# Patient Record
Sex: Female | Born: 1950 | Race: White | Hispanic: No | Marital: Married | Smoking: Never smoker
Health system: Southern US, Community
[De-identification: ages and names within clinical notes are randomized; demographics above are authoritative.]

## PROBLEM LIST (undated history)

## (undated) DIAGNOSIS — E079 Disorder of thyroid, unspecified: Secondary | ICD-10-CM

## (undated) DIAGNOSIS — M1990 Unspecified osteoarthritis, unspecified site: Secondary | ICD-10-CM

## (undated) HISTORY — PX: BREAST LUMPECTOMY: SHX2

## (undated) HISTORY — DX: Unspecified osteoarthritis, unspecified site: M19.90

## (undated) HISTORY — DX: Disorder of thyroid, unspecified: E07.9

---

## 1997-10-13 ENCOUNTER — Other Ambulatory Visit: Admission: RE | Admit: 1997-10-13 | Discharge: 1997-10-13 | Payer: Self-pay | Admitting: Obstetrics & Gynecology

## 1998-01-22 ENCOUNTER — Other Ambulatory Visit: Admission: RE | Admit: 1998-01-22 | Discharge: 1998-01-22 | Payer: Self-pay | Admitting: Orthopedic Surgery

## 1999-03-04 ENCOUNTER — Other Ambulatory Visit: Admission: RE | Admit: 1999-03-04 | Discharge: 1999-03-04 | Payer: Self-pay | Admitting: Obstetrics & Gynecology

## 1999-07-22 ENCOUNTER — Ambulatory Visit (HOSPITAL_COMMUNITY): Admission: RE | Admit: 1999-07-22 | Discharge: 1999-07-22 | Payer: Self-pay

## 2001-11-18 ENCOUNTER — Other Ambulatory Visit: Admission: RE | Admit: 2001-11-18 | Discharge: 2001-11-18 | Payer: Self-pay | Admitting: Obstetrics & Gynecology

## 2002-11-22 ENCOUNTER — Other Ambulatory Visit: Admission: RE | Admit: 2002-11-22 | Discharge: 2002-11-22 | Payer: Self-pay | Admitting: Obstetrics & Gynecology

## 2002-11-28 ENCOUNTER — Encounter (INDEPENDENT_AMBULATORY_CARE_PROVIDER_SITE_OTHER): Payer: Self-pay | Admitting: Specialist

## 2002-11-28 ENCOUNTER — Encounter: Payer: Self-pay | Admitting: Obstetrics & Gynecology

## 2002-11-28 ENCOUNTER — Encounter: Admission: RE | Admit: 2002-11-28 | Discharge: 2002-11-28 | Payer: Self-pay | Admitting: Obstetrics & Gynecology

## 2002-11-28 ENCOUNTER — Encounter (INDEPENDENT_AMBULATORY_CARE_PROVIDER_SITE_OTHER): Payer: Self-pay | Admitting: Radiology

## 2002-12-02 ENCOUNTER — Encounter (HOSPITAL_COMMUNITY): Admission: RE | Admit: 2002-12-02 | Discharge: 2003-03-02 | Payer: Self-pay | Admitting: General Surgery

## 2002-12-05 ENCOUNTER — Encounter: Payer: Self-pay | Admitting: General Surgery

## 2002-12-05 ENCOUNTER — Encounter: Admission: RE | Admit: 2002-12-05 | Discharge: 2002-12-05 | Payer: Self-pay | Admitting: General Surgery

## 2002-12-07 ENCOUNTER — Encounter: Payer: Self-pay | Admitting: General Surgery

## 2002-12-07 ENCOUNTER — Ambulatory Visit (HOSPITAL_COMMUNITY): Admission: RE | Admit: 2002-12-07 | Discharge: 2002-12-07 | Payer: Self-pay | Admitting: General Surgery

## 2002-12-07 ENCOUNTER — Ambulatory Visit (HOSPITAL_BASED_OUTPATIENT_CLINIC_OR_DEPARTMENT_OTHER): Admission: RE | Admit: 2002-12-07 | Discharge: 2002-12-07 | Payer: Self-pay | Admitting: General Surgery

## 2002-12-07 ENCOUNTER — Encounter (INDEPENDENT_AMBULATORY_CARE_PROVIDER_SITE_OTHER): Payer: Self-pay | Admitting: Specialist

## 2003-01-02 ENCOUNTER — Ambulatory Visit: Admission: RE | Admit: 2003-01-02 | Discharge: 2003-04-02 | Payer: Self-pay | Admitting: *Deleted

## 2003-02-08 ENCOUNTER — Encounter: Admission: RE | Admit: 2003-02-08 | Discharge: 2003-02-08 | Payer: Self-pay | Admitting: *Deleted

## 2003-04-28 ENCOUNTER — Ambulatory Visit: Admission: RE | Admit: 2003-04-28 | Discharge: 2003-04-28 | Payer: Self-pay | Admitting: *Deleted

## 2003-08-02 ENCOUNTER — Encounter: Admission: RE | Admit: 2003-08-02 | Discharge: 2003-08-02 | Payer: Self-pay | Admitting: Oncology

## 2003-08-22 ENCOUNTER — Ambulatory Visit: Admission: RE | Admit: 2003-08-22 | Discharge: 2003-09-06 | Payer: Self-pay | Admitting: *Deleted

## 2003-11-29 ENCOUNTER — Encounter: Admission: RE | Admit: 2003-11-29 | Discharge: 2003-11-29 | Payer: Self-pay | Admitting: Oncology

## 2003-12-05 ENCOUNTER — Other Ambulatory Visit: Admission: RE | Admit: 2003-12-05 | Discharge: 2003-12-05 | Payer: Self-pay | Admitting: Obstetrics & Gynecology

## 2004-01-29 ENCOUNTER — Ambulatory Visit: Payer: Self-pay | Admitting: Oncology

## 2004-07-29 ENCOUNTER — Ambulatory Visit: Payer: Self-pay | Admitting: Oncology

## 2004-12-06 ENCOUNTER — Encounter: Admission: RE | Admit: 2004-12-06 | Discharge: 2004-12-06 | Payer: Self-pay | Admitting: Oncology

## 2005-01-23 ENCOUNTER — Ambulatory Visit: Payer: Self-pay | Admitting: Oncology

## 2005-02-11 ENCOUNTER — Other Ambulatory Visit: Admission: RE | Admit: 2005-02-11 | Discharge: 2005-02-11 | Payer: Self-pay | Admitting: Obstetrics & Gynecology

## 2005-02-13 ENCOUNTER — Encounter: Admission: RE | Admit: 2005-02-13 | Discharge: 2005-02-13 | Payer: Self-pay | Admitting: Oncology

## 2005-03-04 ENCOUNTER — Ambulatory Visit (HOSPITAL_COMMUNITY): Admission: RE | Admit: 2005-03-04 | Discharge: 2005-03-04 | Payer: Self-pay | Admitting: Endocrinology

## 2005-03-10 ENCOUNTER — Encounter (INDEPENDENT_AMBULATORY_CARE_PROVIDER_SITE_OTHER): Payer: Self-pay | Admitting: Specialist

## 2005-03-10 ENCOUNTER — Other Ambulatory Visit: Admission: RE | Admit: 2005-03-10 | Discharge: 2005-03-10 | Payer: Self-pay | Admitting: Interventional Radiology

## 2005-03-10 ENCOUNTER — Encounter: Admission: RE | Admit: 2005-03-10 | Discharge: 2005-03-10 | Payer: Self-pay | Admitting: Endocrinology

## 2005-07-30 ENCOUNTER — Ambulatory Visit: Payer: Self-pay | Admitting: Oncology

## 2005-08-01 LAB — CBC WITH DIFFERENTIAL/PLATELET
Basophils Absolute: 0 10*3/uL (ref 0.0–0.1)
EOS%: 1.2 % (ref 0.0–7.0)
Eosinophils Absolute: 0.1 10*3/uL (ref 0.0–0.5)
HCT: 37.5 % (ref 34.8–46.6)
HGB: 12.4 g/dL (ref 11.6–15.9)
MCH: 27.6 pg (ref 26.0–34.0)
MONO#: 0.8 10*3/uL (ref 0.1–0.9)
NEUT%: 65.4 % (ref 39.6–76.8)
lymph#: 2.5 10*3/uL (ref 0.9–3.3)

## 2005-08-01 LAB — COMPREHENSIVE METABOLIC PANEL
Albumin: 4.4 g/dL (ref 3.5–5.2)
BUN: 18 mg/dL (ref 6–23)
CO2: 28 mEq/L (ref 19–32)
Calcium: 9 mg/dL (ref 8.4–10.5)
Chloride: 105 mEq/L (ref 96–112)
Glucose, Bld: 88 mg/dL (ref 70–99)
Potassium: 4.2 mEq/L (ref 3.5–5.3)

## 2005-12-08 ENCOUNTER — Encounter: Admission: RE | Admit: 2005-12-08 | Discharge: 2005-12-08 | Payer: Self-pay | Admitting: Oncology

## 2006-01-22 ENCOUNTER — Ambulatory Visit: Payer: Self-pay | Admitting: Oncology

## 2006-01-27 LAB — COMPREHENSIVE METABOLIC PANEL
AST: 16 U/L (ref 0–37)
Alkaline Phosphatase: 128 U/L — ABNORMAL HIGH (ref 39–117)
BUN: 15 mg/dL (ref 6–23)
Calcium: 9.5 mg/dL (ref 8.4–10.5)
Chloride: 100 mEq/L (ref 96–112)
Creatinine, Ser: 0.71 mg/dL (ref 0.40–1.20)

## 2006-01-27 LAB — CBC WITH DIFFERENTIAL/PLATELET
Eosinophils Absolute: 0.2 10*3/uL (ref 0.0–0.5)
MCV: 84.1 fL (ref 81.0–101.0)
MONO%: 9.4 % (ref 0.0–13.0)
NEUT#: 6 10*3/uL (ref 1.5–6.5)
RBC: 4.6 10*6/uL (ref 3.70–5.32)
RDW: 14.6 % — ABNORMAL HIGH (ref 11.3–14.5)
WBC: 10.2 10*3/uL — ABNORMAL HIGH (ref 3.9–10.0)

## 2006-01-27 LAB — CANCER ANTIGEN 27.29: CA 27.29: 8 U/mL (ref 0–39)

## 2006-03-18 ENCOUNTER — Ambulatory Visit: Payer: Self-pay | Admitting: Oncology

## 2006-07-07 ENCOUNTER — Encounter: Admission: RE | Admit: 2006-07-07 | Discharge: 2006-07-07 | Payer: Self-pay | Admitting: Family Medicine

## 2006-09-07 ENCOUNTER — Encounter: Admission: RE | Admit: 2006-09-07 | Discharge: 2006-09-07 | Payer: Self-pay | Admitting: Endocrinology

## 2006-12-09 ENCOUNTER — Ambulatory Visit: Payer: Self-pay | Admitting: Oncology

## 2006-12-09 LAB — CBC WITH DIFFERENTIAL/PLATELET
Basophils Absolute: 0 10*3/uL (ref 0.0–0.1)
Eosinophils Absolute: 0.1 10*3/uL (ref 0.0–0.5)
HGB: 13.7 g/dL (ref 11.6–15.9)
NEUT#: 6.5 10*3/uL (ref 1.5–6.5)
RBC: 4.73 10*6/uL (ref 3.70–5.32)
RDW: 14.4 % (ref 11.3–14.5)
WBC: 10.2 10*3/uL — ABNORMAL HIGH (ref 3.9–10.0)
lymph#: 3 10*3/uL (ref 0.9–3.3)

## 2006-12-09 LAB — COMPREHENSIVE METABOLIC PANEL
ALT: 20 U/L (ref 0–35)
CO2: 25 mEq/L (ref 19–32)
Calcium: 9.5 mg/dL (ref 8.4–10.5)
Chloride: 101 mEq/L (ref 96–112)
Creatinine, Ser: 0.87 mg/dL (ref 0.40–1.20)
Total Protein: 7.1 g/dL (ref 6.0–8.3)

## 2006-12-09 LAB — CANCER ANTIGEN 27.29: CA 27.29: 7 U/mL (ref 0–39)

## 2006-12-11 ENCOUNTER — Encounter: Admission: RE | Admit: 2006-12-11 | Discharge: 2006-12-11 | Payer: Self-pay | Admitting: Oncology

## 2006-12-16 ENCOUNTER — Encounter: Admission: RE | Admit: 2006-12-16 | Discharge: 2006-12-16 | Payer: Self-pay | Admitting: Orthopedic Surgery

## 2007-11-30 ENCOUNTER — Encounter: Admission: RE | Admit: 2007-11-30 | Discharge: 2007-11-30 | Payer: Self-pay | Admitting: Endocrinology

## 2007-12-13 ENCOUNTER — Encounter: Admission: RE | Admit: 2007-12-13 | Discharge: 2007-12-13 | Payer: Self-pay | Admitting: Oncology

## 2008-01-11 ENCOUNTER — Ambulatory Visit: Payer: Self-pay | Admitting: Oncology

## 2008-01-13 LAB — COMPREHENSIVE METABOLIC PANEL
Albumin: 4 g/dL (ref 3.5–5.2)
Alkaline Phosphatase: 117 U/L (ref 39–117)
CO2: 31 mEq/L (ref 19–32)
Calcium: 9.5 mg/dL (ref 8.4–10.5)
Chloride: 103 mEq/L (ref 96–112)
Glucose, Bld: 98 mg/dL (ref 70–99)
Potassium: 4.4 mEq/L (ref 3.5–5.3)
Sodium: 140 mEq/L (ref 135–145)
Total Protein: 7 g/dL (ref 6.0–8.3)

## 2008-01-13 LAB — CBC WITH DIFFERENTIAL/PLATELET
Eosinophils Absolute: 0.1 10*3/uL (ref 0.0–0.5)
HCT: 41.2 % (ref 34.8–46.6)
MONO#: 0.7 10*3/uL (ref 0.1–0.9)
RBC: 4.83 10*6/uL (ref 3.70–5.32)
RDW: 13.5 % (ref 11.3–14.5)
lymph#: 2.5 10*3/uL (ref 0.9–3.3)

## 2008-04-18 ENCOUNTER — Ambulatory Visit: Payer: Self-pay | Admitting: Oncology

## 2008-04-20 LAB — CBC WITH DIFFERENTIAL/PLATELET
BASO%: 0.4 % (ref 0.0–2.0)
Basophils Absolute: 0 10*3/uL (ref 0.0–0.1)
EOS%: 1 % (ref 0.0–7.0)
Eosinophils Absolute: 0.1 10*3/uL (ref 0.0–0.5)
HCT: 37.5 % (ref 34.8–46.6)
HGB: 12.7 g/dL (ref 11.6–15.9)
LYMPH%: 27.2 % (ref 14.0–49.7)
MCH: 28.8 pg (ref 25.1–34.0)
MCHC: 33.9 g/dL (ref 31.5–36.0)
MCV: 84.8 fL (ref 79.5–101.0)
MONO#: 0.7 10*3/uL (ref 0.1–0.9)
MONO%: 7 % (ref 0.0–14.0)
NEUT#: 6.5 10*3/uL (ref 1.5–6.5)
NEUT%: 64.4 % (ref 38.4–76.8)
Platelets: 390 10*3/uL (ref 145–400)
RBC: 4.42 10*6/uL (ref 3.70–5.45)
RDW: 14.2 % (ref 11.2–14.5)
WBC: 10.1 10*3/uL (ref 3.9–10.3)
lymph#: 2.8 10*3/uL (ref 0.9–3.3)

## 2008-04-20 LAB — COMPREHENSIVE METABOLIC PANEL
ALT: 31 U/L (ref 0–35)
AST: 22 U/L (ref 0–37)
CO2: 29 mEq/L (ref 19–32)
Sodium: 140 mEq/L (ref 135–145)
Total Bilirubin: 0.5 mg/dL (ref 0.3–1.2)
Total Protein: 6.8 g/dL (ref 6.0–8.3)

## 2008-04-20 LAB — CANCER ANTIGEN 27.29: CA 27.29: 14 U/mL (ref 0–39)

## 2009-05-08 ENCOUNTER — Encounter: Admission: RE | Admit: 2009-05-08 | Discharge: 2009-05-08 | Payer: Self-pay | Admitting: Obstetrics & Gynecology

## 2009-06-15 ENCOUNTER — Ambulatory Visit: Payer: Self-pay | Admitting: Oncology

## 2009-06-18 LAB — CBC WITH DIFFERENTIAL/PLATELET
BASO%: 1.4 % (ref 0.0–2.0)
Basophils Absolute: 0.1 10*3/uL (ref 0.0–0.1)
EOS%: 3.6 % (ref 0.0–7.0)
Eosinophils Absolute: 0.3 10*3/uL (ref 0.0–0.5)
HCT: 37.9 % (ref 34.8–46.6)
HGB: 12.8 g/dL (ref 11.6–15.9)
LYMPH%: 30.3 % (ref 14.0–49.7)
MCH: 29.3 pg (ref 25.1–34.0)
MCHC: 33.7 g/dL (ref 31.5–36.0)
MCV: 87 fL (ref 79.5–101.0)
MONO#: 0.7 10*3/uL (ref 0.1–0.9)
MONO%: 8.7 % (ref 0.0–14.0)
NEUT#: 4.8 10*3/uL (ref 1.5–6.5)
NEUT%: 56 % (ref 38.4–76.8)
Platelets: 356 10*3/uL (ref 145–400)
RBC: 4.36 10*6/uL (ref 3.70–5.45)
RDW: 14.3 % (ref 11.2–14.5)
WBC: 8.5 10*3/uL (ref 3.9–10.3)
lymph#: 2.6 10*3/uL (ref 0.9–3.3)

## 2009-06-18 LAB — COMPREHENSIVE METABOLIC PANEL
ALT: 23 U/L (ref 0–35)
AST: 23 U/L (ref 0–37)
Albumin: 3.8 g/dL (ref 3.5–5.2)
Alkaline Phosphatase: 101 U/L (ref 39–117)
BUN: 16 mg/dL (ref 6–23)
CO2: 28 mEq/L (ref 19–32)
Calcium: 9.2 mg/dL (ref 8.4–10.5)
Chloride: 107 mEq/L (ref 96–112)
Creatinine, Ser: 0.64 mg/dL (ref 0.40–1.20)
Glucose, Bld: 74 mg/dL (ref 70–99)
Potassium: 3.9 mEq/L (ref 3.5–5.3)
Sodium: 141 mEq/L (ref 135–145)
Total Bilirubin: 0.6 mg/dL (ref 0.3–1.2)
Total Protein: 6.5 g/dL (ref 6.0–8.3)

## 2009-12-25 ENCOUNTER — Encounter: Admission: RE | Admit: 2009-12-25 | Discharge: 2009-12-25 | Payer: Self-pay | Admitting: Endocrinology

## 2010-07-12 NOTE — Op Note (Signed)
   NAME:  Kristie Wallace, Kristie Wallace                        ACCOUNT NO.:  [id number]   MEDICAL RECORD NO.:  [id number]                   PATIENT TYPE:  AMB   LOCATION:  DSC                                  FACILITY:  [facility]   PHYSICIAN:  Rose Phi. Maple Hudson, M.D.                DATE OF BIRTH:  22-Sep-1950   DATE OF PROCEDURE:  12/26/2002  DATE OF DISCHARGE:  12/07/2002                                 OPERATIVE REPORT   PREOPERATIVE DIAGNOSIS:  Probable stage I carcinoma of the left breast.   POSTOPERATIVE DIAGNOSIS:  Probable stage I carcinoma of the left breast.   OPERATION/PROCEDURE:  1. Blue dye injection.  2. Left sentinel lymph node biopsy.  3. Left partial mastectomy.   SURGEON:  Rose Phi. Maple Hudson, M.D.   ANESTHESIA:  General.   DESCRIPTION OF PROCEDURE:  Prior to the coming to the operating room, 1 mCu  of technetium sulfur colloid was injected intradermally.   After suitable general anesthesia was induced, the patient was placed in the  supine position with the left arm extended on the arm board.  A mixture of 2  mL of methylene blue and 3 mL of injectable saline was then injected in the  subareolar tissue and the breast gently massaged.  We then prepped and  draped the breast and axilla.   A short transverse left axillary incision was made with dissection through  the subcutaneous tissue to the clavipectoral fascia.  Deep to the  clavipectoral fascia was a blue and hot lymph node which was excised and  submitted as a sentinel node.  This was later reported as negative for  metastatic disease.   While that was being evaluated, a curved incision over the mass at the 3:30  position of the left breast was then made and wide excision carried out.  Hemostasis was obtained of the cautery.  Margins were clean on touch prep  evaluation.   Incisions were closed with 3-0 Vicryl and subcuticular 4-0 Monocryl and  Steri-Strips. Dressings were applied.  The patient transverse to the  recovery  room in satisfactory condition having tolerated the procedure well.                                               Rose Phi. Maple Hudson, M.D.    PRY/MEDQ  D:  12/26/2002  T:  12/26/2002  Job:  [id number]

## 2010-07-12 NOTE — Op Note (Signed)
NAME:  Kristie Wallace, Kristie Wallace                        ACCOUNT NO.:  [id number]   MEDICAL RECORD NO.:  [id number]                   PATIENT TYPE:  AMB   LOCATION:  DSC                                  FACILITY:  [facility]   PHYSICIAN:  Rose Phi. Maple Hudson, M.D.                DATE OF BIRTH:  October 02, 1950   DATE OF PROCEDURE:  12/07/2002  DATE OF DISCHARGE:                                 OPERATIVE REPORT   PREOPERATIVE DIAGNOSIS:  Stage I carcinoma of the left breast.   POSTOPERATIVE DIAGNOSIS:  Stage I carcinoma of the left breast.   PROCEDURE:  1. Blue dye injection.  2. Left sentinel lymph node biopsy.  3. Left partial mastectomy.   SURGEON:  Rose Phi. Maple Hudson, M.D.   ANESTHESIA:  General.   DESCRIPTION OF PROCEDURE:  Prior to coming to the operating room, 1  millicurie of Technetium sulfa colloid was injected intradermally.   After suitable general anesthesia was induced, the patient was placed in the  supine position with the arms extended on the arm board.  I injected 5 mL of  a mixture of 2 mL of methylene blue and 3 mL of injectable saline in the  left subareolar area and massaged the breast for about three minutes. We  then prepped and draped the breast and axilla.   A transverse incision was made in the left axilla with dissection through  the subcutaneous tissue to the clavipectoral fascia.  Just beneath this, was  a blue and hot lymph node with two attached lymph nodes which we removed as  sentinel nodes.  There were no other blue palpable or hot nodes. These were  submitted to the pathologist for touch preps.   While that was being done, the small palpable tumor to 3:30 position was  outlined with marking pencil and then a curvilinear incision outlined.  Incision was made and a wide excision of the palpable tumor was carried out  and the specimen oriented for the pathologist.   Touch preps on the sentinel node were negative as were the touch prep on the  margins.  The lumpectomy  incision was closed in two layers with 3-0 Vicryl  and subcuticular 4-0 Monocryl.  The axillary incision was closed in a  similar fashion.  Steri-Strips were applied and then dressings were applied  and the patient transferred to the recovery room in satisfactory condition  having tolerated the procedure well.                                               Rose Phi. Maple Hudson, M.D.    PRY/MEDQ  D:  12/07/2002  T:  12/07/2002  Job:  [id number]   cc:   Kizzie Furnish, M.D.  [address]  [place]  [place] [address]  Fax: [phone]   W. Varney Baas, M.D.  [address] [place]  [place] [address]  Fax: [phone]

## 2010-08-01 ENCOUNTER — Encounter (INDEPENDENT_AMBULATORY_CARE_PROVIDER_SITE_OTHER): Payer: Self-pay | Admitting: General Surgery

## 2010-10-16 ENCOUNTER — Encounter (INDEPENDENT_AMBULATORY_CARE_PROVIDER_SITE_OTHER): Payer: Self-pay | Admitting: General Surgery

## 2012-10-13 ENCOUNTER — Other Ambulatory Visit: Payer: Self-pay | Admitting: Obstetrics & Gynecology

## 2012-10-13 DIAGNOSIS — Z853 Personal history of malignant neoplasm of breast: Secondary | ICD-10-CM

## 2012-11-03 ENCOUNTER — Ambulatory Visit
Admission: RE | Admit: 2012-11-03 | Discharge: 2012-11-03 | Disposition: A | Discharge status: Home / Self Care | Payer: BC Managed Care – PPO | Source: Ambulatory Visit | Attending: Obstetrics & Gynecology | Admitting: Obstetrics & Gynecology

## 2012-11-03 ENCOUNTER — Other Ambulatory Visit: Payer: Self-pay | Admitting: Obstetrics & Gynecology

## 2012-11-03 DIAGNOSIS — Z853 Personal history of malignant neoplasm of breast: Secondary | ICD-10-CM

## 2015-07-31 DIAGNOSIS — H43393 Other vitreous opacities, bilateral: Secondary | ICD-10-CM | POA: Diagnosis not present

## 2015-07-31 DIAGNOSIS — H2513 Age-related nuclear cataract, bilateral: Secondary | ICD-10-CM | POA: Diagnosis not present

## 2015-09-07 ENCOUNTER — Other Ambulatory Visit: Payer: Self-pay | Admitting: Physician Assistant

## 2015-09-07 DIAGNOSIS — E039 Hypothyroidism, unspecified: Secondary | ICD-10-CM | POA: Diagnosis not present

## 2015-09-07 DIAGNOSIS — E2839 Other primary ovarian failure: Secondary | ICD-10-CM

## 2015-09-07 DIAGNOSIS — Z1211 Encounter for screening for malignant neoplasm of colon: Secondary | ICD-10-CM | POA: Diagnosis not present

## 2015-09-07 DIAGNOSIS — Z1231 Encounter for screening mammogram for malignant neoplasm of breast: Secondary | ICD-10-CM

## 2015-09-07 DIAGNOSIS — Z853 Personal history of malignant neoplasm of breast: Secondary | ICD-10-CM | POA: Diagnosis not present

## 2015-09-07 DIAGNOSIS — Z0000 Encounter for general adult medical examination without abnormal findings: Secondary | ICD-10-CM | POA: Diagnosis not present

## 2015-09-07 DIAGNOSIS — Z23 Encounter for immunization: Secondary | ICD-10-CM | POA: Diagnosis not present

## 2015-09-07 DIAGNOSIS — E7800 Pure hypercholesterolemia, unspecified: Secondary | ICD-10-CM | POA: Diagnosis not present

## 2015-09-07 DIAGNOSIS — Z1389 Encounter for screening for other disorder: Secondary | ICD-10-CM | POA: Diagnosis not present

## 2015-09-07 DIAGNOSIS — Z9181 History of falling: Secondary | ICD-10-CM | POA: Diagnosis not present

## 2015-09-17 DIAGNOSIS — F418 Other specified anxiety disorders: Secondary | ICD-10-CM | POA: Diagnosis not present

## 2015-09-17 DIAGNOSIS — Z6829 Body mass index (BMI) 29.0-29.9, adult: Secondary | ICD-10-CM | POA: Diagnosis not present

## 2015-09-24 ENCOUNTER — Ambulatory Visit
Admission: RE | Admit: 2015-09-24 | Discharge: 2015-09-24 | Disposition: A | Discharge status: Home / Self Care | Payer: Medicare Other | Source: Ambulatory Visit | Attending: Physician Assistant | Admitting: Physician Assistant

## 2015-09-24 DIAGNOSIS — Z1231 Encounter for screening mammogram for malignant neoplasm of breast: Secondary | ICD-10-CM | POA: Diagnosis not present

## 2015-09-24 DIAGNOSIS — E2839 Other primary ovarian failure: Secondary | ICD-10-CM

## 2015-09-24 DIAGNOSIS — M85852 Other specified disorders of bone density and structure, left thigh: Secondary | ICD-10-CM | POA: Diagnosis not present

## 2015-09-24 DIAGNOSIS — Z780 Asymptomatic menopausal state: Secondary | ICD-10-CM | POA: Diagnosis not present

## 2015-10-15 DIAGNOSIS — Z6829 Body mass index (BMI) 29.0-29.9, adult: Secondary | ICD-10-CM | POA: Diagnosis not present

## 2015-10-15 DIAGNOSIS — F419 Anxiety disorder, unspecified: Secondary | ICD-10-CM | POA: Diagnosis not present

## 2015-10-22 ENCOUNTER — Other Ambulatory Visit: Payer: Self-pay | Admitting: Gastroenterology

## 2015-10-22 DIAGNOSIS — D125 Benign neoplasm of sigmoid colon: Secondary | ICD-10-CM | POA: Diagnosis not present

## 2015-10-22 DIAGNOSIS — Z1211 Encounter for screening for malignant neoplasm of colon: Secondary | ICD-10-CM | POA: Diagnosis not present

## 2015-10-22 DIAGNOSIS — D122 Benign neoplasm of ascending colon: Secondary | ICD-10-CM | POA: Diagnosis not present

## 2015-10-22 DIAGNOSIS — D126 Benign neoplasm of colon, unspecified: Secondary | ICD-10-CM | POA: Diagnosis not present

## 2015-10-22 DIAGNOSIS — K640 First degree hemorrhoids: Secondary | ICD-10-CM | POA: Diagnosis not present

## 2016-04-16 DIAGNOSIS — F329 Major depressive disorder, single episode, unspecified: Secondary | ICD-10-CM | POA: Diagnosis not present

## 2016-04-16 DIAGNOSIS — F419 Anxiety disorder, unspecified: Secondary | ICD-10-CM | POA: Diagnosis not present

## 2016-06-17 DIAGNOSIS — E039 Hypothyroidism, unspecified: Secondary | ICD-10-CM | POA: Diagnosis not present

## 2016-06-17 DIAGNOSIS — I8393 Asymptomatic varicose veins of bilateral lower extremities: Secondary | ICD-10-CM | POA: Diagnosis not present

## 2016-06-17 DIAGNOSIS — R600 Localized edema: Secondary | ICD-10-CM | POA: Diagnosis not present

## 2016-07-30 DIAGNOSIS — L84 Corns and callosities: Secondary | ICD-10-CM | POA: Diagnosis not present

## 2016-07-30 DIAGNOSIS — Z6828 Body mass index (BMI) 28.0-28.9, adult: Secondary | ICD-10-CM | POA: Diagnosis not present

## 2016-07-30 DIAGNOSIS — M21619 Bunion of unspecified foot: Secondary | ICD-10-CM | POA: Diagnosis not present

## 2016-07-30 DIAGNOSIS — M79671 Pain in right foot: Secondary | ICD-10-CM | POA: Diagnosis not present

## 2016-08-28 ENCOUNTER — Ambulatory Visit: Payer: Medicare Other | Admitting: Sports Medicine

## 2016-09-25 ENCOUNTER — Ambulatory Visit (INDEPENDENT_AMBULATORY_CARE_PROVIDER_SITE_OTHER): Payer: Medicare Other | Admitting: Sports Medicine

## 2016-09-25 ENCOUNTER — Encounter: Payer: Self-pay | Admitting: Sports Medicine

## 2016-09-25 VITALS — BP 119/68 | HR 71 | Ht 62.0 in | Wt 158.0 lb

## 2016-09-25 DIAGNOSIS — M2042 Other hammer toe(s) (acquired), left foot: Secondary | ICD-10-CM | POA: Diagnosis not present

## 2016-09-25 DIAGNOSIS — M79675 Pain in left toe(s): Secondary | ICD-10-CM

## 2016-09-25 DIAGNOSIS — L84 Corns and callosities: Secondary | ICD-10-CM

## 2016-09-25 DIAGNOSIS — M2041 Other hammer toe(s) (acquired), right foot: Secondary | ICD-10-CM | POA: Diagnosis not present

## 2016-09-25 DIAGNOSIS — M205X2 Other deformities of toe(s) (acquired), left foot: Secondary | ICD-10-CM | POA: Diagnosis not present

## 2016-09-25 NOTE — Progress Notes (Signed)
Subjective: Kristie Wallace is a 66 y.o. female patient who presents to office for evaluation of Left foot pain secondary to callus skin. Patient complains of pain at the lesion present Left foot at the 1st toe. Patient has tried epsom salt with no relief in symptoms but states that CVD hemp cream does help. Patient is a barefoot walker. Patient denies any other pedal complaints.   There are no active problems to display for this patient.   Current Outpatient Prescriptions on File Prior to Visit  Medication Sig Dispense Refill  . Cholecalciferol (VITAMIN D PO) Take by mouth.      . levothyroxine (SYNTHROID) 25 MCG tablet Take 25 mcg by mouth daily.      Marland Kitchen. liothyronine (CYTOMEL) 5 MCG tablet Take 5 mcg by mouth daily.      . Loratadine (CLARITIN PO) Take by mouth.      . Multiple Vitamin (MULTIVITAMIN) tablet Take 1 tablet by mouth daily.       No current facility-administered medications on file prior to visit.     Allergies  Allergen Reactions  . Aspirin Other (See Comments)    Fainting  . Naproxen Sodium Other (See Comments)    Fainting  . Tramadol Other (See Comments)    Paranoia    Objective:  General: Alert and oriented x3 in no acute distress  Dermatology: Keratotic lesion present plantar medial left hallux with skin lines transversing the lesion, pain is present with direct pressure to the lesion with a central nucleated area noted, no webspace macerations, no ecchymosis bilateral, all nails x 10 are well manicured.  Vascular: Dorsalis Pedis and Posterior Tibial pedal pulses 2/4, Capillary Fill Time 3 seconds, + pedal hair growth bilateral, no edema bilateral lower extremities, Temperature gradient within normal limits.  Neurology: Michaell CowingGross sensation intact via light touch bilateral.  Musculoskeletal: Mild tenderness with palpation at the keratotic lesion site onLeft, Muscular strength 5/5 in all groups without pain or limitation on range of motion. + limited range of motion  with prominent eminence at 1st MTPJ on left and lesser hammertoes bilateral.  Assessment and Plan: Problem List Items Addressed This Visit    None    Visit Diagnoses    Callus of foot    -  Primary   Great toe pain, left       Hallux limitus of left foot       Hammer toes of both feet         -Complete examination performed -Discussed treatment options for callus and secondary foot deformities that are contributing to callus pattern -Parred keratoic lesion x 1 using a chisel blade -Dispensed silicone toe padding  -Encouraged daily skin emollients like [place] Healthy feet -Encouraged use of pumice stone -Advised good supportive shoes and advised patient on future need of orthotics vs surgery if continues to be bothersome -Patient to return to office as needed or sooner if condition worsens.  Asencion Islamitorya Bexleigh Theriault, DPM

## 2016-09-25 NOTE — Patient Instructions (Signed)
[facility]

## 2016-10-20 DIAGNOSIS — Z0000 Encounter for general adult medical examination without abnormal findings: Secondary | ICD-10-CM | POA: Diagnosis not present

## 2016-10-20 DIAGNOSIS — E7800 Pure hypercholesterolemia, unspecified: Secondary | ICD-10-CM | POA: Diagnosis not present

## 2016-10-20 DIAGNOSIS — F419 Anxiety disorder, unspecified: Secondary | ICD-10-CM | POA: Diagnosis not present

## 2016-10-20 DIAGNOSIS — F329 Major depressive disorder, single episode, unspecified: Secondary | ICD-10-CM | POA: Diagnosis not present

## 2016-12-01 ENCOUNTER — Other Ambulatory Visit: Payer: Self-pay | Admitting: Physician Assistant

## 2016-12-01 DIAGNOSIS — Z1231 Encounter for screening mammogram for malignant neoplasm of breast: Secondary | ICD-10-CM

## 2016-12-22 ENCOUNTER — Ambulatory Visit: Payer: Medicare Other

## 2017-01-19 ENCOUNTER — Ambulatory Visit
Admission: RE | Admit: 2017-01-19 | Discharge: 2017-01-19 | Disposition: A | Discharge status: Home / Self Care | Payer: Medicare Other | Source: Ambulatory Visit | Attending: Physician Assistant | Admitting: Physician Assistant

## 2017-01-19 DIAGNOSIS — Z1231 Encounter for screening mammogram for malignant neoplasm of breast: Secondary | ICD-10-CM | POA: Diagnosis not present

## 2017-03-11 DIAGNOSIS — Z6828 Body mass index (BMI) 28.0-28.9, adult: Secondary | ICD-10-CM | POA: Diagnosis not present

## 2017-03-11 DIAGNOSIS — Z1331 Encounter for screening for depression: Secondary | ICD-10-CM | POA: Diagnosis not present

## 2017-03-11 DIAGNOSIS — Z23 Encounter for immunization: Secondary | ICD-10-CM | POA: Diagnosis not present

## 2017-03-11 DIAGNOSIS — J019 Acute sinusitis, unspecified: Secondary | ICD-10-CM | POA: Diagnosis not present

## 2017-03-20 DIAGNOSIS — Z6829 Body mass index (BMI) 29.0-29.9, adult: Secondary | ICD-10-CM | POA: Diagnosis not present

## 2017-03-20 DIAGNOSIS — L508 Other urticaria: Secondary | ICD-10-CM | POA: Diagnosis not present

## 2017-04-29 DIAGNOSIS — L508 Other urticaria: Secondary | ICD-10-CM | POA: Diagnosis not present

## 2017-04-29 DIAGNOSIS — R5383 Other fatigue: Secondary | ICD-10-CM | POA: Diagnosis not present

## 2017-04-29 DIAGNOSIS — F419 Anxiety disorder, unspecified: Secondary | ICD-10-CM | POA: Diagnosis not present

## 2017-04-29 DIAGNOSIS — Z6829 Body mass index (BMI) 29.0-29.9, adult: Secondary | ICD-10-CM | POA: Diagnosis not present

## 2017-05-06 DIAGNOSIS — G4733 Obstructive sleep apnea (adult) (pediatric): Secondary | ICD-10-CM | POA: Diagnosis not present

## 2017-05-06 DIAGNOSIS — R0602 Shortness of breath: Secondary | ICD-10-CM | POA: Diagnosis not present

## 2017-05-07 DIAGNOSIS — R0602 Shortness of breath: Secondary | ICD-10-CM | POA: Diagnosis not present

## 2017-05-07 DIAGNOSIS — G4733 Obstructive sleep apnea (adult) (pediatric): Secondary | ICD-10-CM | POA: Diagnosis not present

## 2017-11-04 DIAGNOSIS — Z6829 Body mass index (BMI) 29.0-29.9, adult: Secondary | ICD-10-CM | POA: Diagnosis not present

## 2017-11-04 DIAGNOSIS — Z9181 History of falling: Secondary | ICD-10-CM | POA: Diagnosis not present

## 2017-11-04 DIAGNOSIS — F419 Anxiety disorder, unspecified: Secondary | ICD-10-CM | POA: Diagnosis not present

## 2017-11-04 DIAGNOSIS — G4733 Obstructive sleep apnea (adult) (pediatric): Secondary | ICD-10-CM | POA: Diagnosis not present

## 2018-01-01 ENCOUNTER — Other Ambulatory Visit: Payer: Self-pay | Admitting: Physician Assistant

## 2018-01-01 DIAGNOSIS — Z1231 Encounter for screening mammogram for malignant neoplasm of breast: Secondary | ICD-10-CM

## 2018-01-06 DIAGNOSIS — Z23 Encounter for immunization: Secondary | ICD-10-CM | POA: Diagnosis not present

## 2018-02-12 ENCOUNTER — Ambulatory Visit
Admission: RE | Admit: 2018-02-12 | Discharge: 2018-02-12 | Disposition: A | Discharge status: Home / Self Care | Payer: Medicare Other | Source: Ambulatory Visit | Attending: Physician Assistant | Admitting: Physician Assistant

## 2018-02-12 DIAGNOSIS — Z1231 Encounter for screening mammogram for malignant neoplasm of breast: Secondary | ICD-10-CM

## 2018-05-18 DIAGNOSIS — E039 Hypothyroidism, unspecified: Secondary | ICD-10-CM | POA: Diagnosis not present

## 2018-05-18 DIAGNOSIS — E7800 Pure hypercholesterolemia, unspecified: Secondary | ICD-10-CM | POA: Diagnosis not present

## 2018-05-18 DIAGNOSIS — F419 Anxiety disorder, unspecified: Secondary | ICD-10-CM | POA: Diagnosis not present

## 2018-05-18 DIAGNOSIS — E663 Overweight: Secondary | ICD-10-CM | POA: Diagnosis not present

## 2018-08-23 DIAGNOSIS — Z1331 Encounter for screening for depression: Secondary | ICD-10-CM | POA: Diagnosis not present

## 2018-08-23 DIAGNOSIS — Z9181 History of falling: Secondary | ICD-10-CM | POA: Diagnosis not present

## 2018-08-23 DIAGNOSIS — Z0000 Encounter for general adult medical examination without abnormal findings: Secondary | ICD-10-CM | POA: Diagnosis not present

## 2018-08-23 DIAGNOSIS — Z6829 Body mass index (BMI) 29.0-29.9, adult: Secondary | ICD-10-CM | POA: Diagnosis not present

## 2019-09-08 IMAGING — MG DIGITAL SCREENING BILATERAL MAMMOGRAM WITH TOMO AND CAD
8 series · 8 of 24 positions shown · non-contrast
Comparison: Previous exam(s).

CLINICAL DATA: Screening.

EXAM:
DIGITAL SCREENING BILATERAL MAMMOGRAM WITH TOMO AND CAD

[L MLO synth-2D]
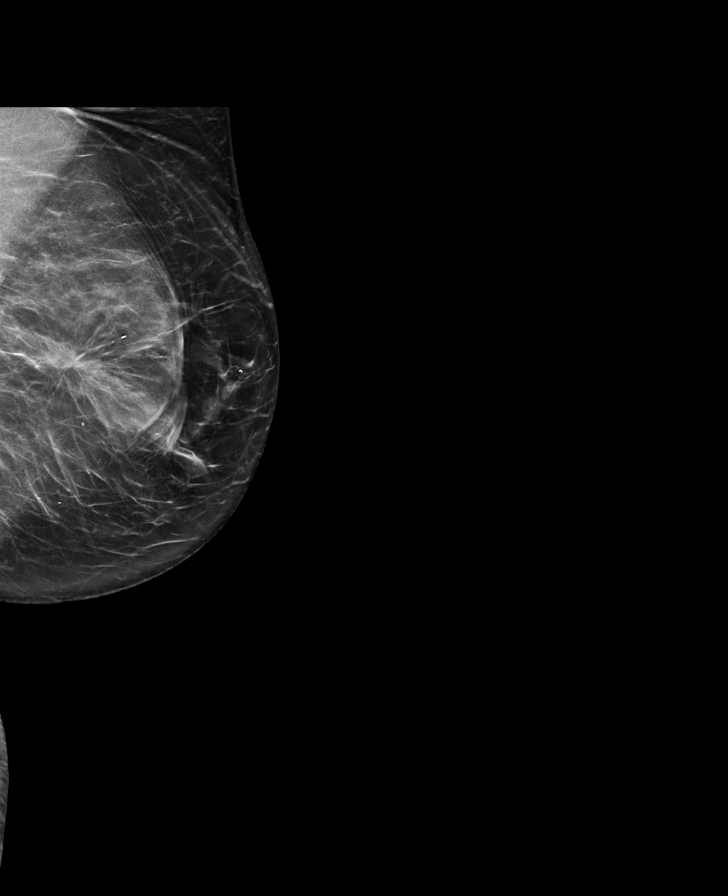

[L CC synth-2D]
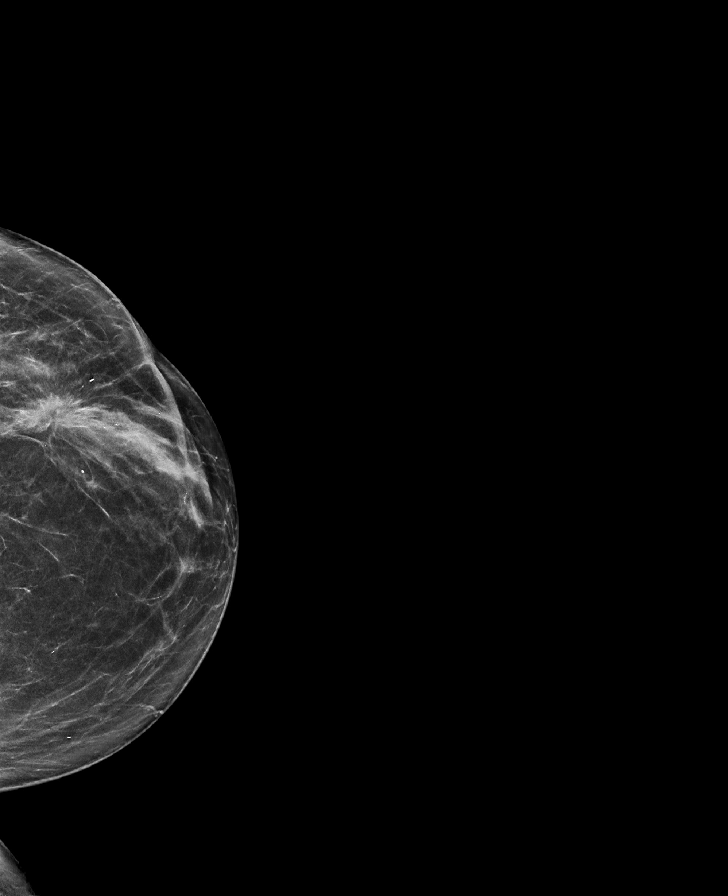

[R CC synth-2D]
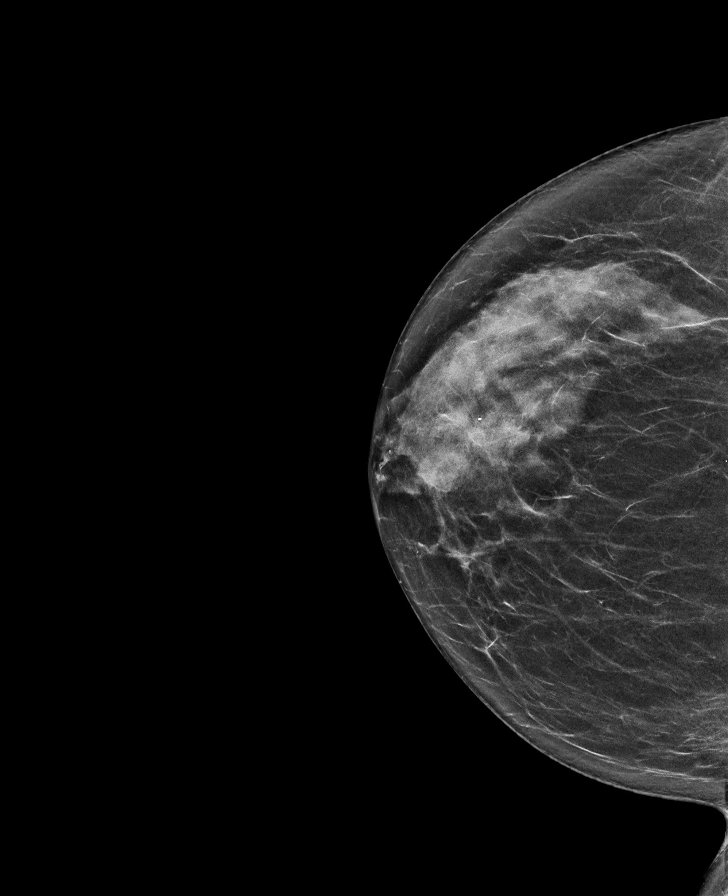

[R MLO synth-2D]
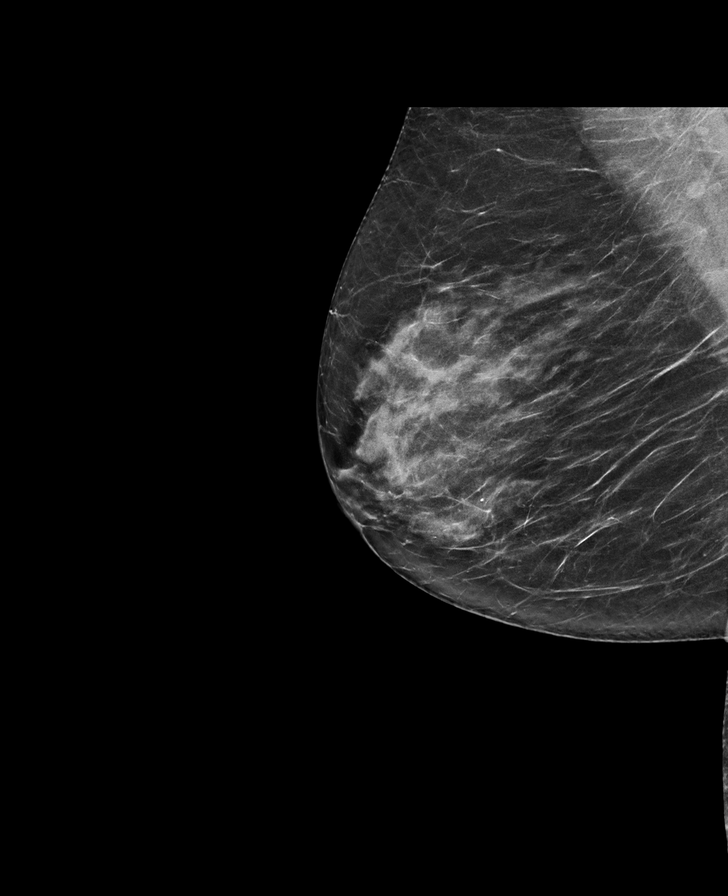

[L CC tomo · tomo slice 33/65.0]
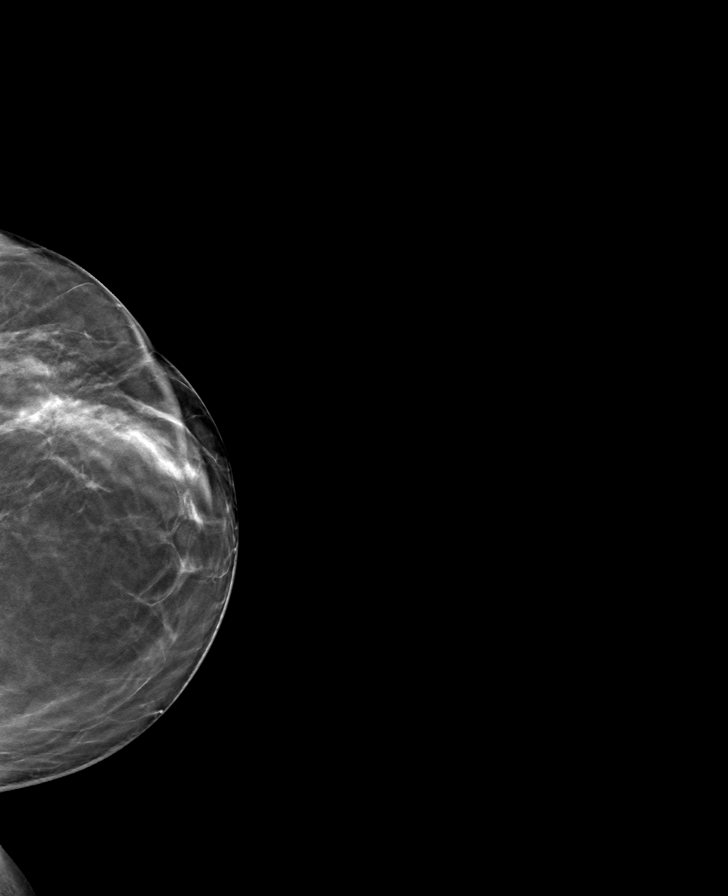

[L MLO tomo · tomo slice 38/75.0]
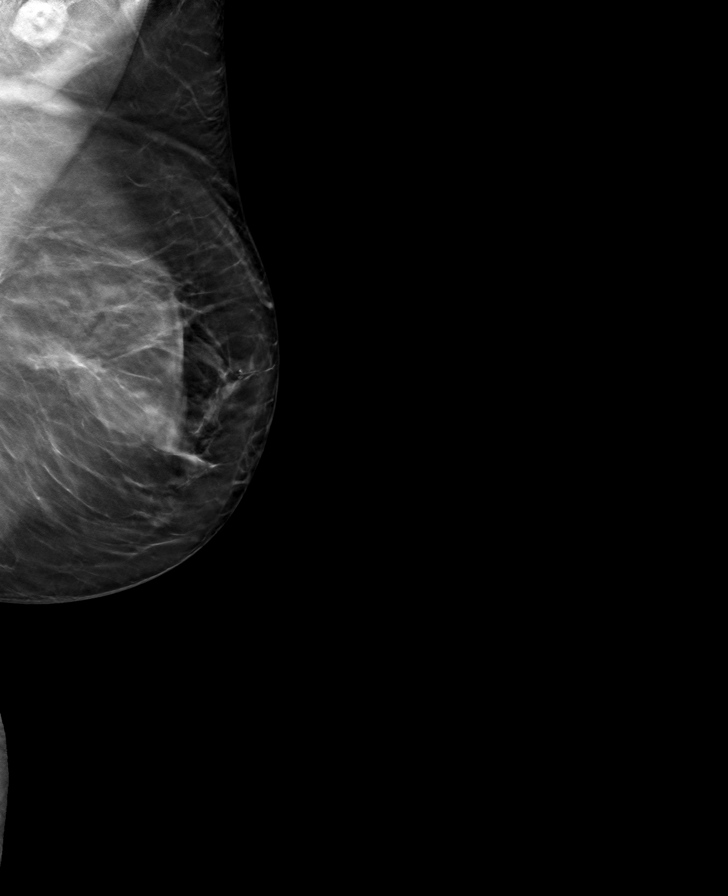

[R MLO tomo · tomo slice 37/73.0]
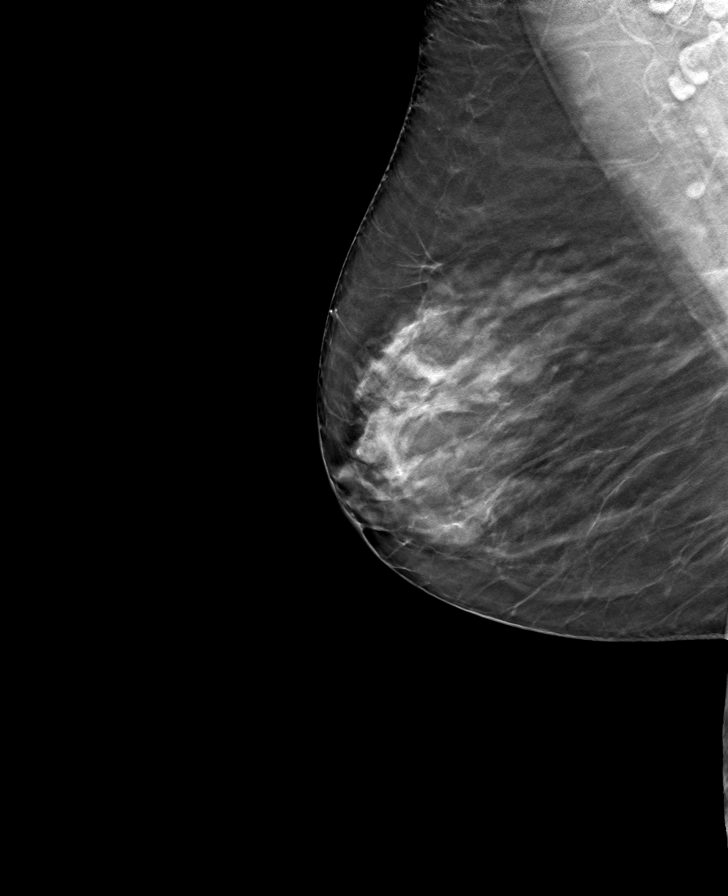

[R CC tomo · tomo slice 35/69.0]
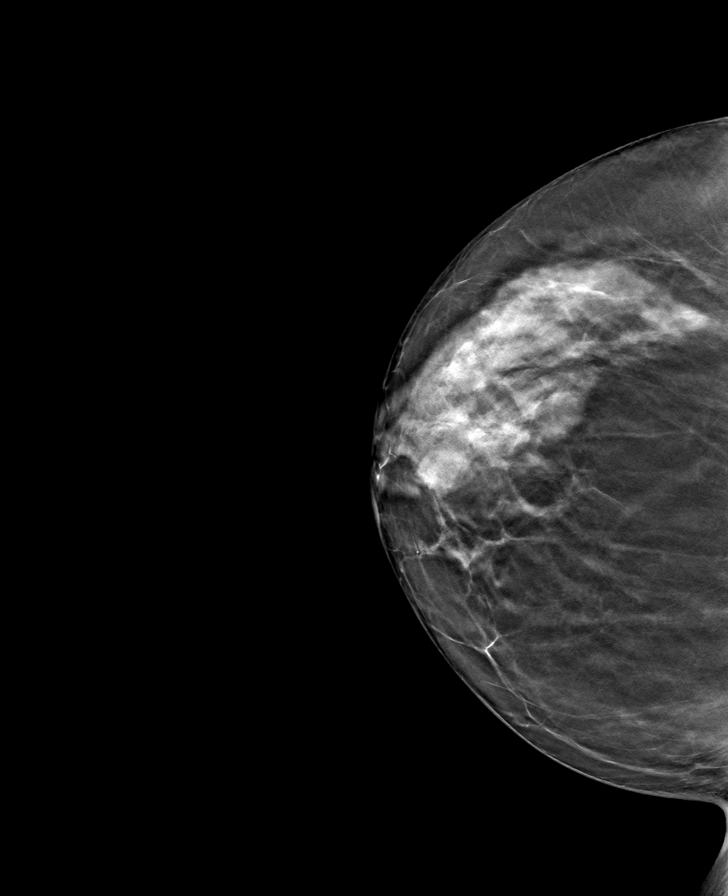

[8 of 24 positions shown; findings below may reference images not displayed]

ACR Breast Density Category c: The breast tissue is heterogeneously
dense, which may obscure small masses.
FINDINGS: There are no findings suspicious for malignancy. Images were
processed with CAD.
IMPRESSION: No mammographic evidence of malignancy. A result letter of this
screening mammogram will be mailed directly to the patient.

RECOMMENDATION:
Screening mammogram in one year. (Code:[id number]-[id number])

BI-RADS CATEGORY  1: Negative.
